# Patient Record
Sex: Female | Born: 1965 | Race: White | Hispanic: No | Marital: Single
Health system: Southern US, Community
[De-identification: ages and names within clinical notes are randomized; demographics above are authoritative.]

---

## 2021-03-10 ENCOUNTER — Other Ambulatory Visit: Payer: Self-pay

## 2021-03-10 ENCOUNTER — Emergency Department (HOSPITAL_COMMUNITY)
Admission: EM | Admit: 2021-03-10 | Discharge: 2021-03-10 | Disposition: A | Discharge status: Home / Self Care | Payer: Commercial Managed Care - PPO | Attending: Emergency Medicine | Admitting: Emergency Medicine

## 2021-03-10 ENCOUNTER — Emergency Department (HOSPITAL_COMMUNITY): Payer: Commercial Managed Care - PPO

## 2021-03-10 ENCOUNTER — Encounter (HOSPITAL_COMMUNITY): Payer: Self-pay | Admitting: Emergency Medicine

## 2021-03-10 DIAGNOSIS — M25559 Pain in unspecified hip: Secondary | ICD-10-CM

## 2021-03-10 DIAGNOSIS — M25561 Pain in right knee: Secondary | ICD-10-CM | POA: Diagnosis not present

## 2021-03-10 DIAGNOSIS — M25551 Pain in right hip: Secondary | ICD-10-CM | POA: Insufficient documentation

## 2021-03-10 DIAGNOSIS — M25571 Pain in right ankle and joints of right foot: Secondary | ICD-10-CM | POA: Insufficient documentation

## 2021-03-10 DIAGNOSIS — W19XXXA Unspecified fall, initial encounter: Secondary | ICD-10-CM | POA: Insufficient documentation

## 2021-03-10 DIAGNOSIS — Y990 Civilian activity done for income or pay: Secondary | ICD-10-CM | POA: Diagnosis not present

## 2021-03-10 MED ORDER — IBUPROFEN 200 MG PO TABS
600.0000 mg | ORAL_TABLET | Freq: Once | ORAL | Status: AC
  Administered 2021-03-10: 600 mg via ORAL
  Filled 2021-03-10: qty 3

## 2021-03-10 NOTE — ED Triage Notes (Signed)
BIBA Per EMS: Pt had a fall at work and is complianing of R hip, knee and ankle pain. No obvious deformity or injury

## 2021-03-10 NOTE — Discharge Instructions (Signed)

## 2021-03-10 NOTE — Progress Notes (Signed)
Orthopedic Tech Progress Note Patient Details:  Kristi Lane 12-15-1965 [id number]  Ortho Devices Type of Ortho Device: Knee Sleeve,Crutches Ortho Device/Splint Location: Right Knee Ortho Device/Splint Interventions: Application,Adjustment   Post Interventions Patient Tolerated: Well   Kristi Lane 03/10/2021, 4:11 PM

## 2021-03-10 NOTE — ED Provider Notes (Signed)
[facility]-EMERGENCY DEPT Provider Note   CSN: [id number] Arrival date & time: 03/10/21  1315     History Chief Complaint  Patient presents with  . Knee Injury  . Ankle Pain  . Hip Pain    Right     Kristi Lane is a 55 y.o. female.  HPI   55 year old female presenting the emergency department today for evaluation after a fall.  Patient states that she was carrying 40 pounds of frozen pizza when she stepped on her she relates that was untied and is caused her to lose her balance.  She twisted her right ankle fell onto her right knee and hip.  She denies any head trauma or LOC.  She is complaining of constant severe pain to the right hip knee and ankle.  Pain is worse with weightbearing.  She denies any other injuries at this time.  History reviewed. No pertinent past medical history.  There are no problems to display for this patient.   History reviewed. No pertinent surgical history.   OB History   No obstetric history on file.     History reviewed. No pertinent family history.     Home Medications Prior to Admission medications   Not on File    Allergies    Patient has no allergy information on record.  Review of Systems   Review of Systems  Constitutional: Negative for fever.  Musculoskeletal: Negative for back pain and neck pain.       Right hip, knee and ankle pain  Skin: Negative for wound.  Neurological:       No head trauma or loc    Physical Exam Updated Vital Signs BP (!) 166/95 (BP Location: Left Arm)   Pulse 78   Temp 99 F (37.2 C) (Oral)   Resp 18   SpO2 100%   Physical Exam Vitals and nursing note reviewed.  Constitutional:      General: She is not in acute distress.    Appearance: She is well-developed.  HENT:     Head: Normocephalic and atraumatic.  Eyes:     Conjunctiva/sclera: Conjunctivae normal.  Cardiovascular:     Rate and Rhythm: Normal rate.  Pulmonary:     Effort: Pulmonary effort is normal.   Musculoskeletal:        General: Normal range of motion.     Cervical back: Neck supple.     Comments: TTP to the right hip, right lateral knee, right lateral malleolus. No joint laxity to the right knee with varus/valgus stress or with anterior/posterior drawer testing.   Skin:    General: Skin is warm and dry.  Neurological:     Mental Status: She is alert.     ED Results / Procedures / Treatments   Labs (all labs ordered are listed, but only abnormal results are displayed) Labs Reviewed - No data to display  EKG None  Radiology DG Ankle Complete Right  Result Date: 03/10/2021 CLINICAL DATA:  Pain following fall EXAM: RIGHT ANKLE - COMPLETE 3+ VIEW COMPARISON:  None. FINDINGS: Frontal, oblique, and lateral views were obtained. No evident fracture or joint effusion. There is joint space narrowing medially with spurring medially. There are posterior and inferior calcaneal spurs. No erosive changes. Ankle mortise appears intact. IMPRESSION: No appreciable fracture. Osteoarthritic change medially. There are calcaneal spurs. Ankle mortise appears intact. Electronically Signed   By: Bretta Bang III M.D.   On: 03/10/2021 15:27   DG Knee Complete 4 Views  Right  Result Date: 03/10/2021 CLINICAL DATA:  Pain following fall EXAM: RIGHT KNEE - COMPLETE 4+ VIEW COMPARISON:  None. FINDINGS: Frontal, lateral, and bilateral oblique views were obtained. There is no acute fracture or dislocation. No joint effusion. There is narrowing medially and in the patellofemoral joint regions. There is spurring in all compartments. No erosions. Calcifications noted along the anterior tibia may represent residua of old trauma. IMPRESSION: No appreciable acute fracture or dislocation. Calcifications anterior to the proximal tibia may represent residua of prior trauma. No joint effusion. Osteoarthritic change noted, most notably medially and in the patellofemoral joint regions. Electronically Signed   By: Bretta Bang III M.D.   On: 03/10/2021 15:26   DG Hip Unilat W or Wo Pelvis 2-3 Views Right  Result Date: 03/10/2021 CLINICAL DATA:  Pain following fall EXAM: DG HIP (WITH OR WITHOUT PELVIS) 2-3V RIGHT COMPARISON:  None. FINDINGS: Frontal pelvis as well as frontal and lateral right hip images were obtained. No fracture or dislocation. There is mild symmetric narrowing of each hip joint. No erosive changes. Sacroiliac joints appear normal bilaterally. IMPRESSION: Slight symmetric narrowing of each hip joint. No fracture or dislocation. Electronically Signed   By: Bretta Bang III M.D.   On: 03/10/2021 15:24    Procedures Procedures   Medications Ordered in ED Medications  ibuprofen (ADVIL) tablet 600 mg (600 mg Oral Given 03/10/21 1429)    ED Course  I have reviewed the triage vital signs and the nursing notes.  Pertinent labs & imaging results that were available during my care of the patient were reviewed by me and considered in my medical decision making (see chart for details).    MDM Rules/Calculators/A&P                          55 y/o F here after a mechanical fall c/o right hip, right knee and right ankle pain.   Xray right hip neg for acute traumatic injury Xray right knee neg for acute traumatic injury Xray right ankle neg for acute traumatic injury  Pt given knee sleeve and crutches. She is given ortho f/u. Advised on pain meds and rice protocol. Advised on f/u and return precautions. She voices understanding of the plan and reasons to return. All questions answered, pt stable for discharge.     Final Clinical Impression(s) / ED Diagnoses Final diagnoses:  Fall, initial encounter  Acute pain of right knee  Hip pain  Acute right ankle pain    Rx / DC Orders ED Discharge Orders    None       Rayne Du 03/10/21 1601    Gerhard Munch, MD 03/11/21 2257

## 2022-04-22 IMAGING — CR DG HIP (WITH OR WITHOUT PELVIS) 2-3V*R*
3 series · 3 of 3 positions shown · non-contrast
Comparison: None.

CLINICAL DATA: Pain following fall

EXAM:
DG HIP (WITH OR WITHOUT PELVIS) 2-3V RIGHT

[t pelvis ap]
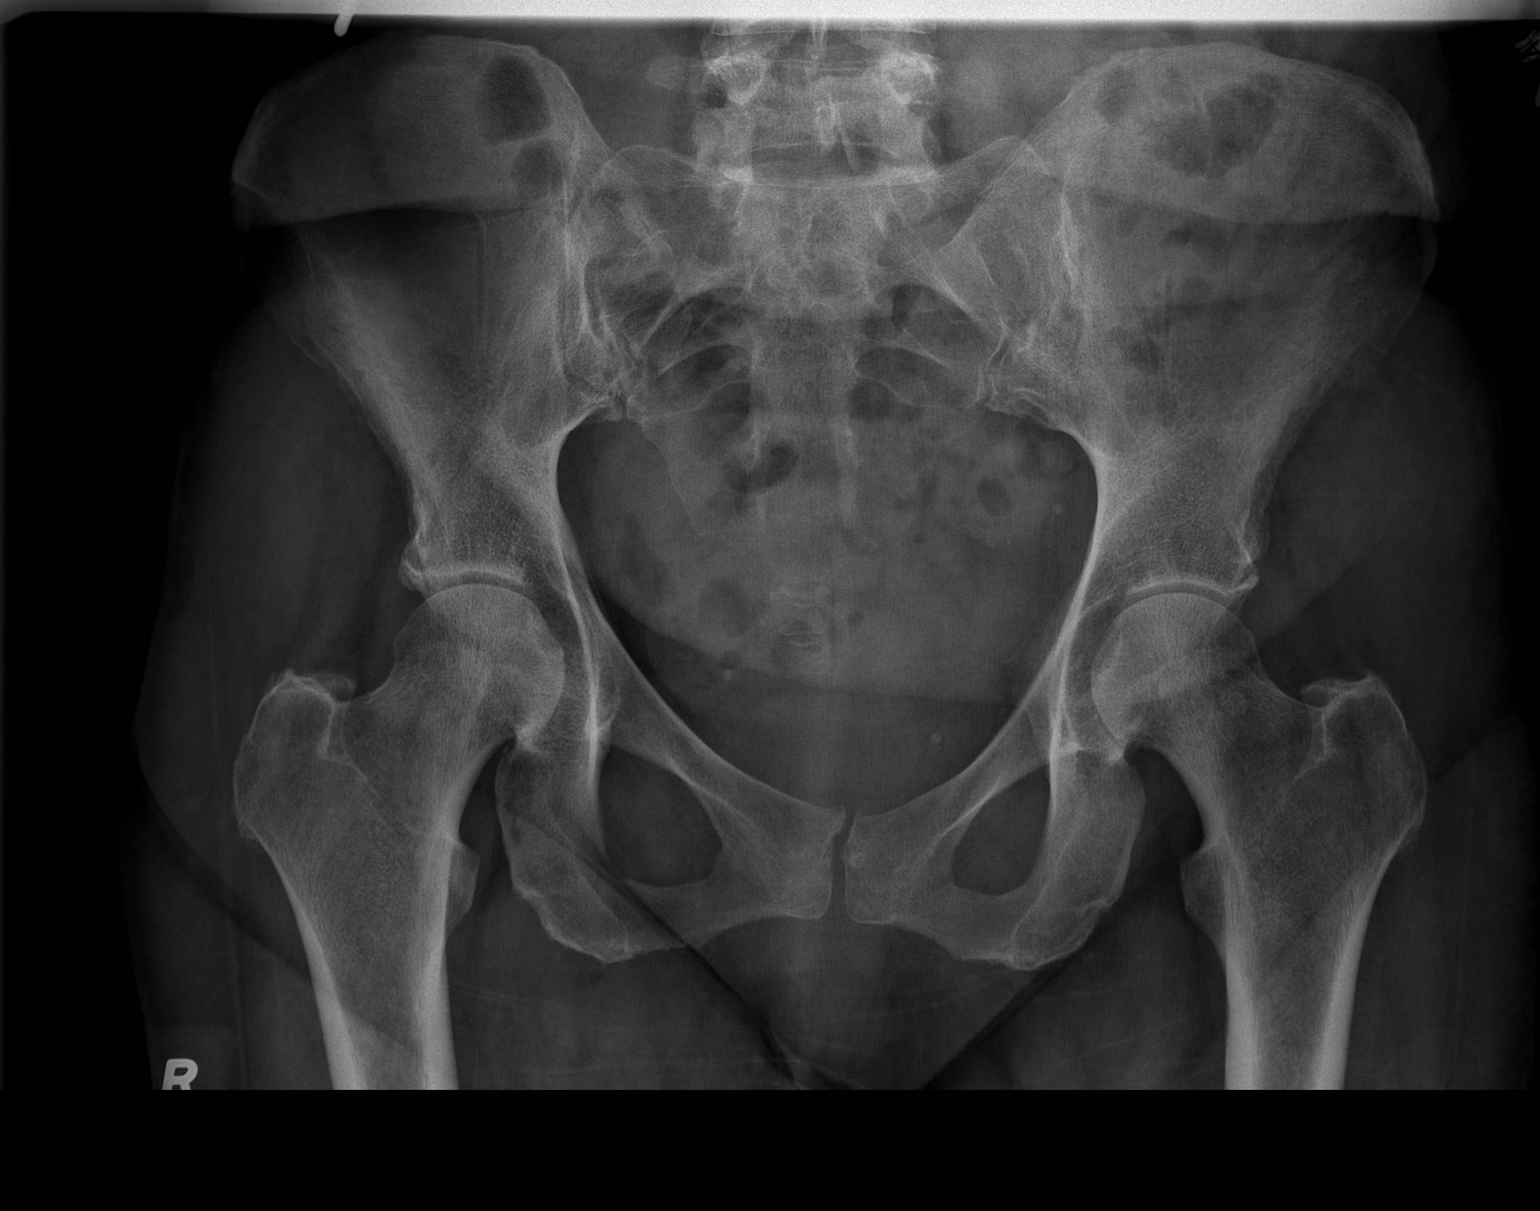

[t hip ap right]
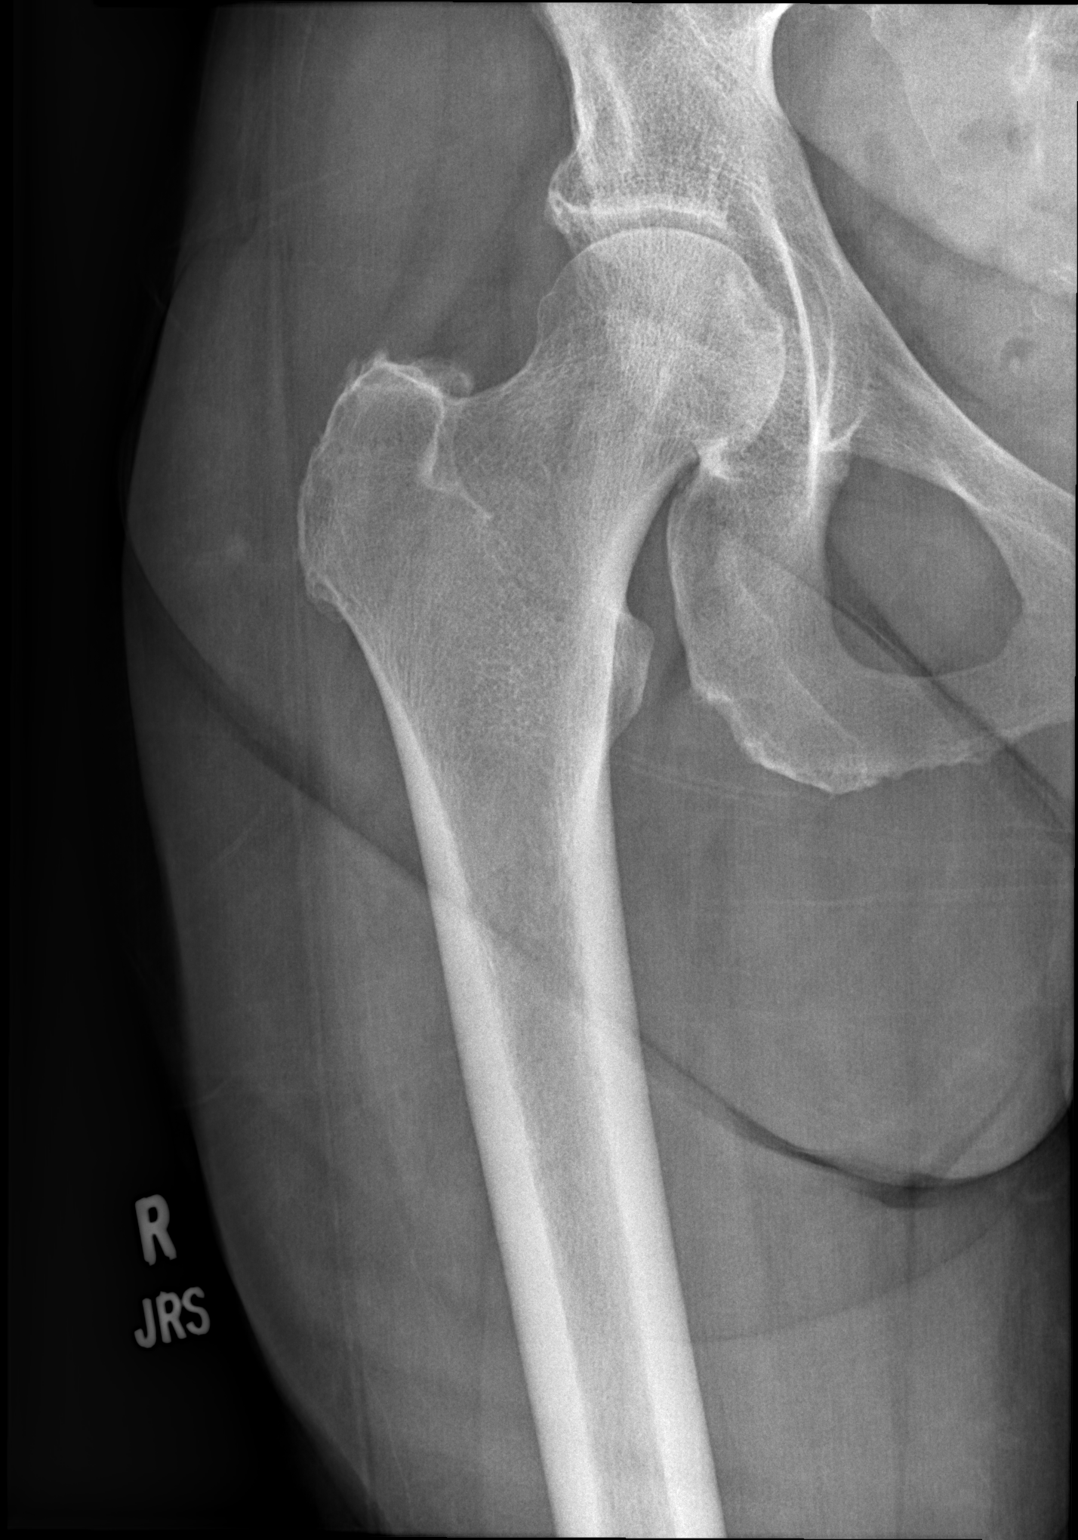

[t hip frog leg right]
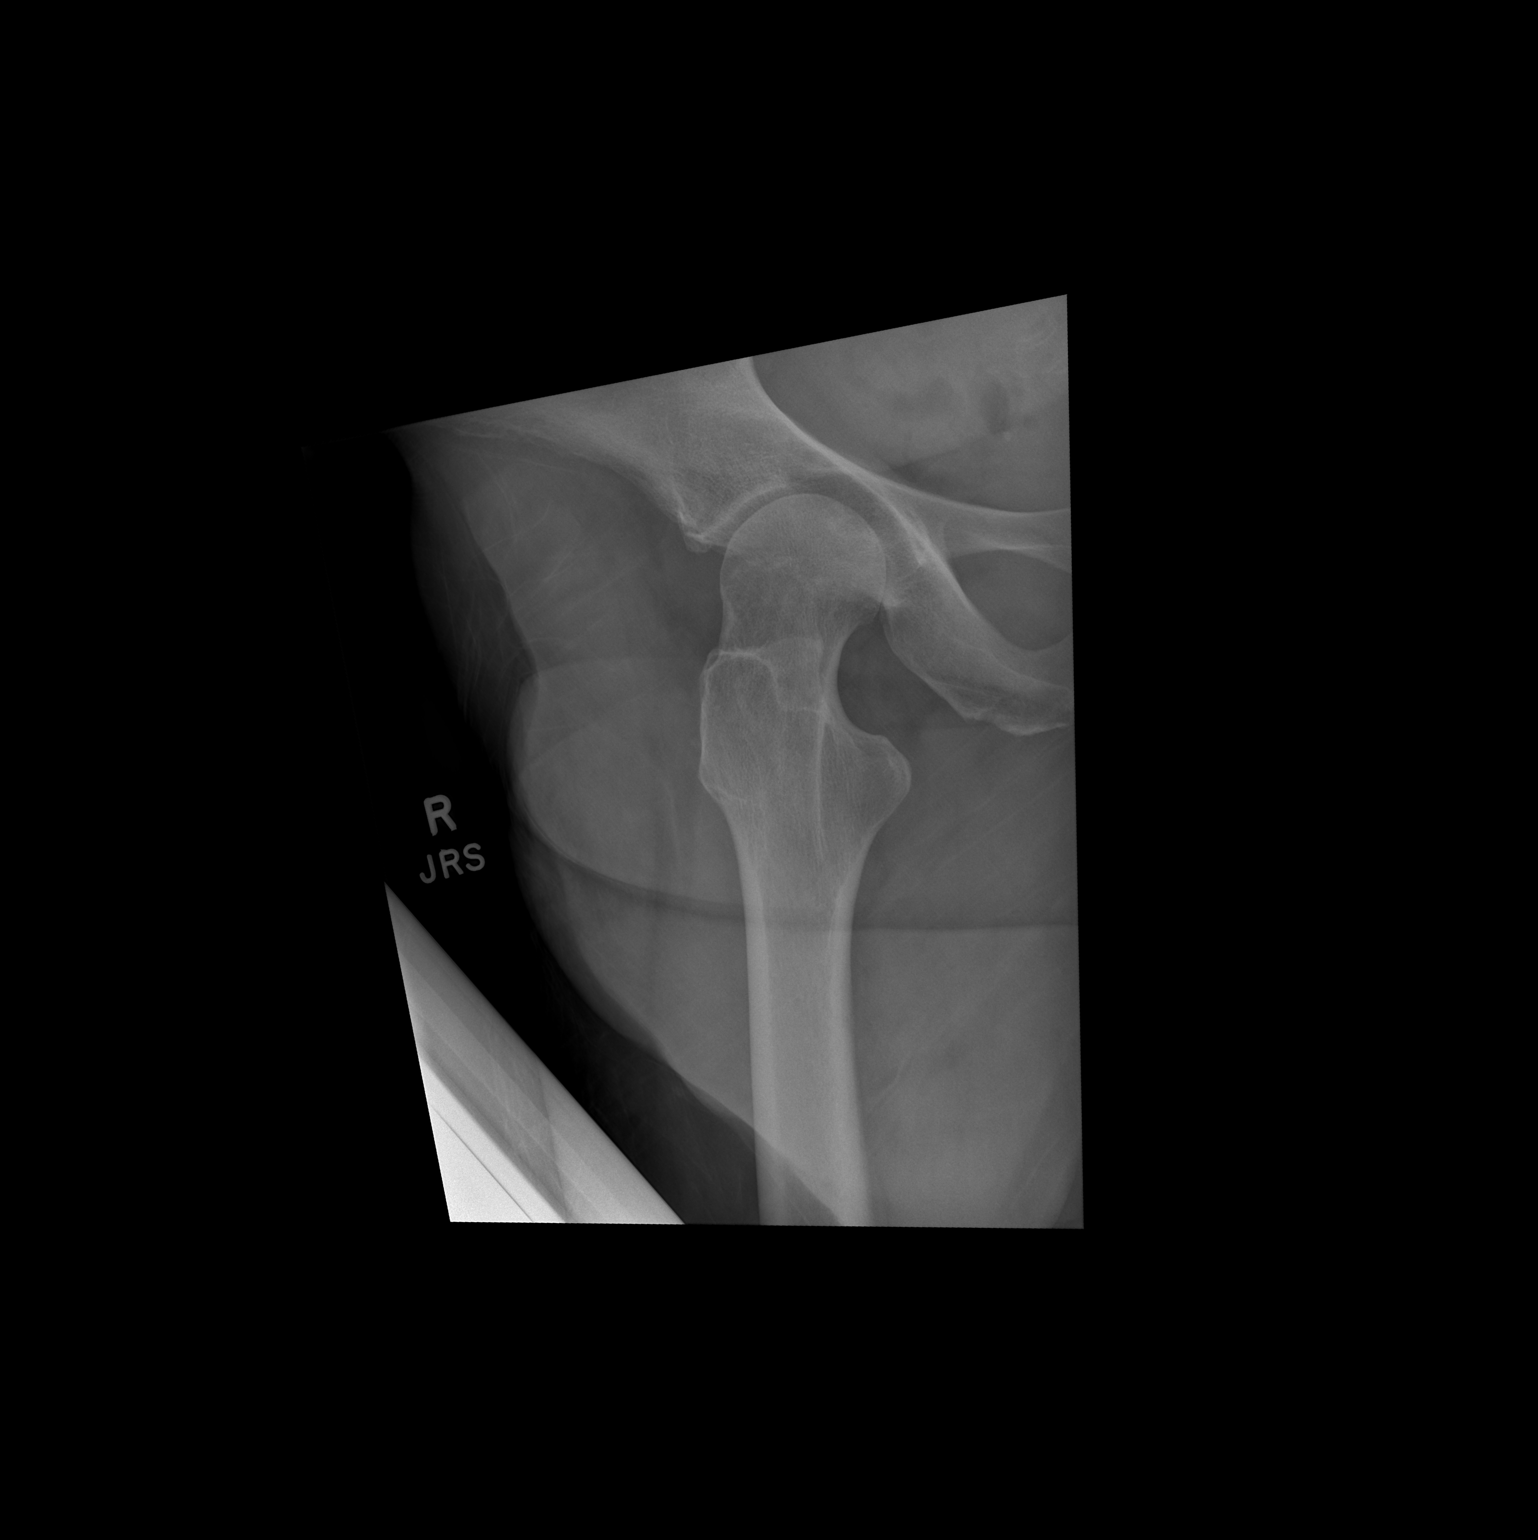

[3 of 3 positions shown; findings below may reference images not displayed]

FINDINGS: Frontal pelvis as well as frontal and lateral right hip images were
obtained. No fracture or dislocation. There is mild symmetric
narrowing of each hip joint. No erosive changes. Sacroiliac joints
appear normal bilaterally.
IMPRESSION: Slight symmetric narrowing of each hip joint. No fracture or
dislocation.
